# Patient Record
Sex: Female | Born: 1990 | Race: White | Hispanic: No | Marital: Single | Smoking: Never smoker
Health system: Southern US, Community
[De-identification: ages and names within clinical notes are randomized; demographics above are authoritative.]

---

## 2015-04-03 ENCOUNTER — Ambulatory Visit (INDEPENDENT_AMBULATORY_CARE_PROVIDER_SITE_OTHER): Payer: Self-pay | Admitting: Pharmacist

## 2015-04-03 ENCOUNTER — Ambulatory Visit (INDEPENDENT_AMBULATORY_CARE_PROVIDER_SITE_OTHER): Payer: Self-pay | Admitting: Family Medicine

## 2015-04-03 ENCOUNTER — Encounter: Payer: Self-pay | Admitting: Pharmacist

## 2015-04-03 ENCOUNTER — Ambulatory Visit
Admission: RE | Admit: 2015-04-03 | Discharge: 2015-04-03 | Disposition: A | Discharge status: Home / Self Care | Payer: PRIVATE HEALTH INSURANCE | Source: Ambulatory Visit | Attending: Family Medicine | Admitting: Family Medicine

## 2015-04-03 ENCOUNTER — Encounter: Payer: Self-pay | Admitting: Family Medicine

## 2015-04-03 VITALS — BP 144/81 | HR 100 | Temp 99.4°F | Ht 65.0 in | Wt 202.3 lb

## 2015-04-03 DIAGNOSIS — Y9315 Activity, underwater diving and snorkeling: Secondary | ICD-10-CM

## 2015-04-03 LAB — GLUCOSE, POCT (MANUAL RESULT ENTRY): POC GLUCOSE: 98 mg/dL (ref 70–99)

## 2015-04-03 LAB — POCT UA - GLUCOSE/PROTEIN
GLUCOSE UA: NEGATIVE
PROTEIN UA: NEGATIVE

## 2015-04-03 LAB — POCT HEMOGLOBIN: HEMOGLOBIN: 14.4 g/dL (ref 12.2–16.2)

## 2015-04-03 LAB — GLUCOSE, CAPILLARY: Glucose-Capillary: 98 mg/dL (ref 65–99)

## 2015-04-03 NOTE — Assessment & Plan Note (Signed)
Spirometry evaluation without bronchodilator reveals normal lung function.  FEV1, FVC and FEV1/FVC ratio all exceed threshold for spirometric parameters.   Reviewed results of pulmonary function tests. 

## 2015-04-03 NOTE — Assessment & Plan Note (Signed)
Vision (distance, near, color), hearing, UA, CBG, Hgb, and Spirometry all within normal limits. Normal CXR today. EKG:  n/a Approval for SCUBA diving, I find no medical conditions considered incompatible with diving. Due to age and lack of medical conditions, she qualifies for 1 year certification.

## 2015-04-03 NOTE — Progress Notes (Signed)
Subjective:    Tanya Clark is a 24 y.o. female who presents to [facility] today for scuba diving physical for the [facility]:  1.  Diving physical:  First certified in SCUBA diving age 53.  Denies any complications or injuries while diving.  Has never had a fitness to dive physical.  Currently well, without complaints.  The following portions of the patient's history were reviewed and updated as appropriate: allergies, current medications, past medical history, family and social history, and problem list.   PMH:   - denies - No other hospitalizations or other prior medical history   PSH: - denies  Family History: - noncontributory  Social: - Never smoker - Denies illicit drug use - Very occasional social drinker (1-2 drinks)  SCUBA ROS:  He denies any history of middle ear trauma/disease, vertigo, ocular surgery, asthma or other respiratory issues, seizures, loss of consciousness, recurring neurologic disorders, history of head injury, coagulopathies, evidence of CAD or other structural heart disease, pneumothorax, diabetes, or exercise intolerance.    General ROS:  The patient denies fever, unusual weight change, decreased hearing, chest pain, palpitations, pre-syncopal or syncopal episodes, dyspnea on exertion, prolonged cough, hemoptysis, change in bowel habits, melena, hematochezia, severe indigestion/heartburn, nausea/vomiting/abdominal pain, genital sores, muscle weakness, difficulty walking, abnormal bleeding, or enlarged lymph nodes.     Objective:   Physical Exam BP 144/81 mmHg  Pulse 100  Temp(Src) 99.4 F (37.4 C) (Oral)  Ht  (1.651 m)  Wt 202 lb 4.8 oz (91.763 kg)  BMI 33.66 kg/m2 Gen:  Alert, cooperative patient who appears stated age in no acute distress.  Vital signs reviewed. Head:  [place]/AT Eyes:  Fundoscopy WNL BL.  PERRL, EOMI Ears:  External ears WNL, Bilateral TM's normal without retraction, redness or bulging.  Canals clear BL  Mouth:  Good  dental hygiene. Tonsils non-erythematous, non-edematous.   MMM Neck:  Trachea midline Cardiac:  Regular rate and rhythm without murmur auscultated.   Pulm:  Clear to auscultation bilaterally with good air movement throuhout.  No wheezes or rales noted.   Abd:  Soft/nondistended/nontender.  Good bowel sounds throughout all four quadrants.  No masses noted.  Exts: No edema BL LE's, warm and well-perfused Neuro:  Alert and oriented to person, place, and date.  CN II-XII intact.  Sensation intact to light touch and vibration bilateral upper and lower extremities equally.  Motor function equal and strength 5/5 bilateral upper and lower extremities.  Normal gait.  DTRs +2 BL achilles and patellar.  Romberg negative. Color vision testing normal. Psych:  Not depressed or anxious appearing.  Linear and coherent thought process as evidenced by speech pattern. Smiles spontaneously.   Results for orders placed or performed in visit on 04/03/15 (from the past 72 hour(s))  Urinalysis - Glucose/Protein     Status: None   Collection Time: 04/03/15 10:41 AM  Result Value Ref Range   Glucose, UA negative    Protein, UA negative   Glucose (CBG)     Status: None   Collection Time: 04/03/15 10:41 AM  Result Value Ref Range   POC Glucose 98 70 - 99 mg/dl  Hemoglobin     Status: None   Collection Time: 04/03/15 10:41 AM  Result Value Ref Range   Hemoglobin 14.4 12.2 - 16.2 g/dL  Glucose, capillary     Status: None   Collection Time: 04/03/15 11:12 AM  Result Value Ref Range   Glucose-Capillary 98 65 - 99 mg/dL

## 2015-04-03 NOTE — Progress Notes (Signed)
S:    Patient arrives in good spirits.    Presents for lung function evaluation for "dive physical". Patient reports breathing has been normal.   Denies history or breathing difficulty.   O: Patient provided good effort while attempting spirometry.  FVC 4.87    Calculated Lower Limit for NOAA Diving Standards   FVC = 3.19 FEV1 4.57       Calculated Lower Limit for NOAA Diving Standards   FEV1= 2.76 FEV1/FVC  94   Calculated Lower Limit for NOAA Diving Standards   FEV1/FVC = 75.9  See "scanned report" or Documentation Flowsheet (discrete results - PFTs) for  Spirometry results and copy of evaluation.   A/P: Spirometry evaluation without bronchodilator reveals normal lung function.  FEV1, FVC and FEV1/FVC ratio all exceed threshold for spirometric parameters.   Reviewed results of pulmonary function tests.  Pt verbalized understanding of results and education.  Patient seen with Yevonne Pax, PharmD Candidate, Lilla Shook, PharmD, resident, and Dr. Sampson Goon.

## 2015-04-03 NOTE — Progress Notes (Deleted)
Subjective:    Tanya Clark is a 24 y.o. female who presents to [facility] today for scuba diving physical for the [facility]:  1.  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  Denies any complications or injuries while diving.  Has never failed a fitness to dive physical.  Currently well, without complaints.  The following portions of the patient's history were reviewed and updated as appropriate: allergies, current medications, past medical history, family and social history, and problem list.  PMH reviewed.  No past medical history on file. No past surgical history on file.  Medications reviewed. No current outpatient prescriptions on file.   No current facility-administered medications for this visit.     PMH:   - None - No other hospitalizations or other prior medical history   PSH: - None  Family History: - None  Social: - Never smoker - Denies illicit drug use - Very occasional social drinker (1-2 drinks)  SCUBA ROS:  He denies any history of middle ear trauma/disease, vertigo, ocular surgery, asthma or other respiratory issues, seizures, loss of consciousness, recurring neurologic disorders, history of head injury, coagulopathies, evidence of CAD or other structural heart disease, pneumothorax, diabetes, or exercise intolerance.    General ROS:  The patient denies fever, unusual weight change, decreased hearing, chest pain, palpitations, pre-syncopal or syncopal episodes, dyspnea on exertion, prolonged cough, hemoptysis, change in bowel habits, melena, hematochezia, severe indigestion/heartburn, nausea/vomiting/abdominal pain, genital sores, muscle weakness, difficulty walking, abnormal bleeding, or enlarged lymph nodes.     Objective:   Physical Exam BP 144/81 mmHg  Pulse 100  Temp(Src) 99.4 F (37.4 C) (Oral)  Ht  (1.651 m)  Wt 202 lb 4.8 oz (91.763 kg)  BMI 33.66 kg/m2 Gen:  Alert, cooperative patient  who appears stated age in no acute distress.  Vital signs reviewed. Head:  [place]/AT Eyes:  Fundoscopy WNL BL.  PERRL 3mm, EOMI. Ears:  External ears WNL, Bilateral TM's normal without retraction, redness or bulging.  Cerumen noted in canals bilaterally. Mouth:  Good dental hygiene. Tonsils non-erythematous, non-edematous.   MMM Neck:  Trachea midline Cardiac:  Regular rate and rhythm without murmur auscultated.   Pulm:  Clear to auscultation bilaterally with good air movement throuhout.  No wheezes or rales noted.   Abd:  Soft/nondistended/nontender.  Good bowel sounds throughout all four quadrants.  No masses noted.  Exts: No edema BL LE's, warm and well-perfused Neuro:  Alert and oriented to person, place, and date.  CN II-XII intact.  EOMI.  No astigmatism noted.  Sensation intact to light touch and vibration bilateral upper and lower extremities equally.  Motor function equal and strength 5/5 bilateral upper and lower extremities.  Normal gait.  DTRs +2 BL patellar and achilles.  Finger to nose cerebellar testing within normal limits.  Color vision testing normal.  Negative Romberg. Psych:  Not depressed or anxious appearing.  Linear and coherent thought process as evidenced by speech pattern. Smiles spontaneously.   Results for orders placed or performed in visit on 04/03/15 (from the past 72 hour(s))  Urinalysis - Glucose/Protein     Status: None   Collection Time: 04/03/15 10:41 AM  Result Value Ref Range   Glucose, UA negative    Protein, UA negative   Glucose (CBG)     Status: None   Collection Time: 04/03/15 10:41 AM  Result Value Ref Range   POC Glucose 98 70 - 99 mg/dl  Hemoglobin  Status: None   Collection Time: 04/03/15 10:41 AM  Result Value Ref Range   Hemoglobin 14.4 12.2 - 16.2 g/dL  Glucose, capillary     Status: None   Collection Time: 04/03/15 11:12 AM  Result Value Ref Range   Glucose-Capillary 98 65 - 99 mg/dL    Nelly Rout, NP [facility]

## 2015-04-06 NOTE — Progress Notes (Signed)
Patient ID: Tanya Clark, female   DOB: 04-Dec-1990, 24 y.o.   MRN: [id number] Reviewed: I agree with Dr. Macky Lower documentation and management.

## 2016-05-17 IMAGING — CR DG CHEST 2V
2 series · 2 of 2 positions shown · non-contrast
Comparison: None.

CLINICAL DATA: Pre-employment physical

EXAM:
CHEST - 2 VIEW

[w chest pa]
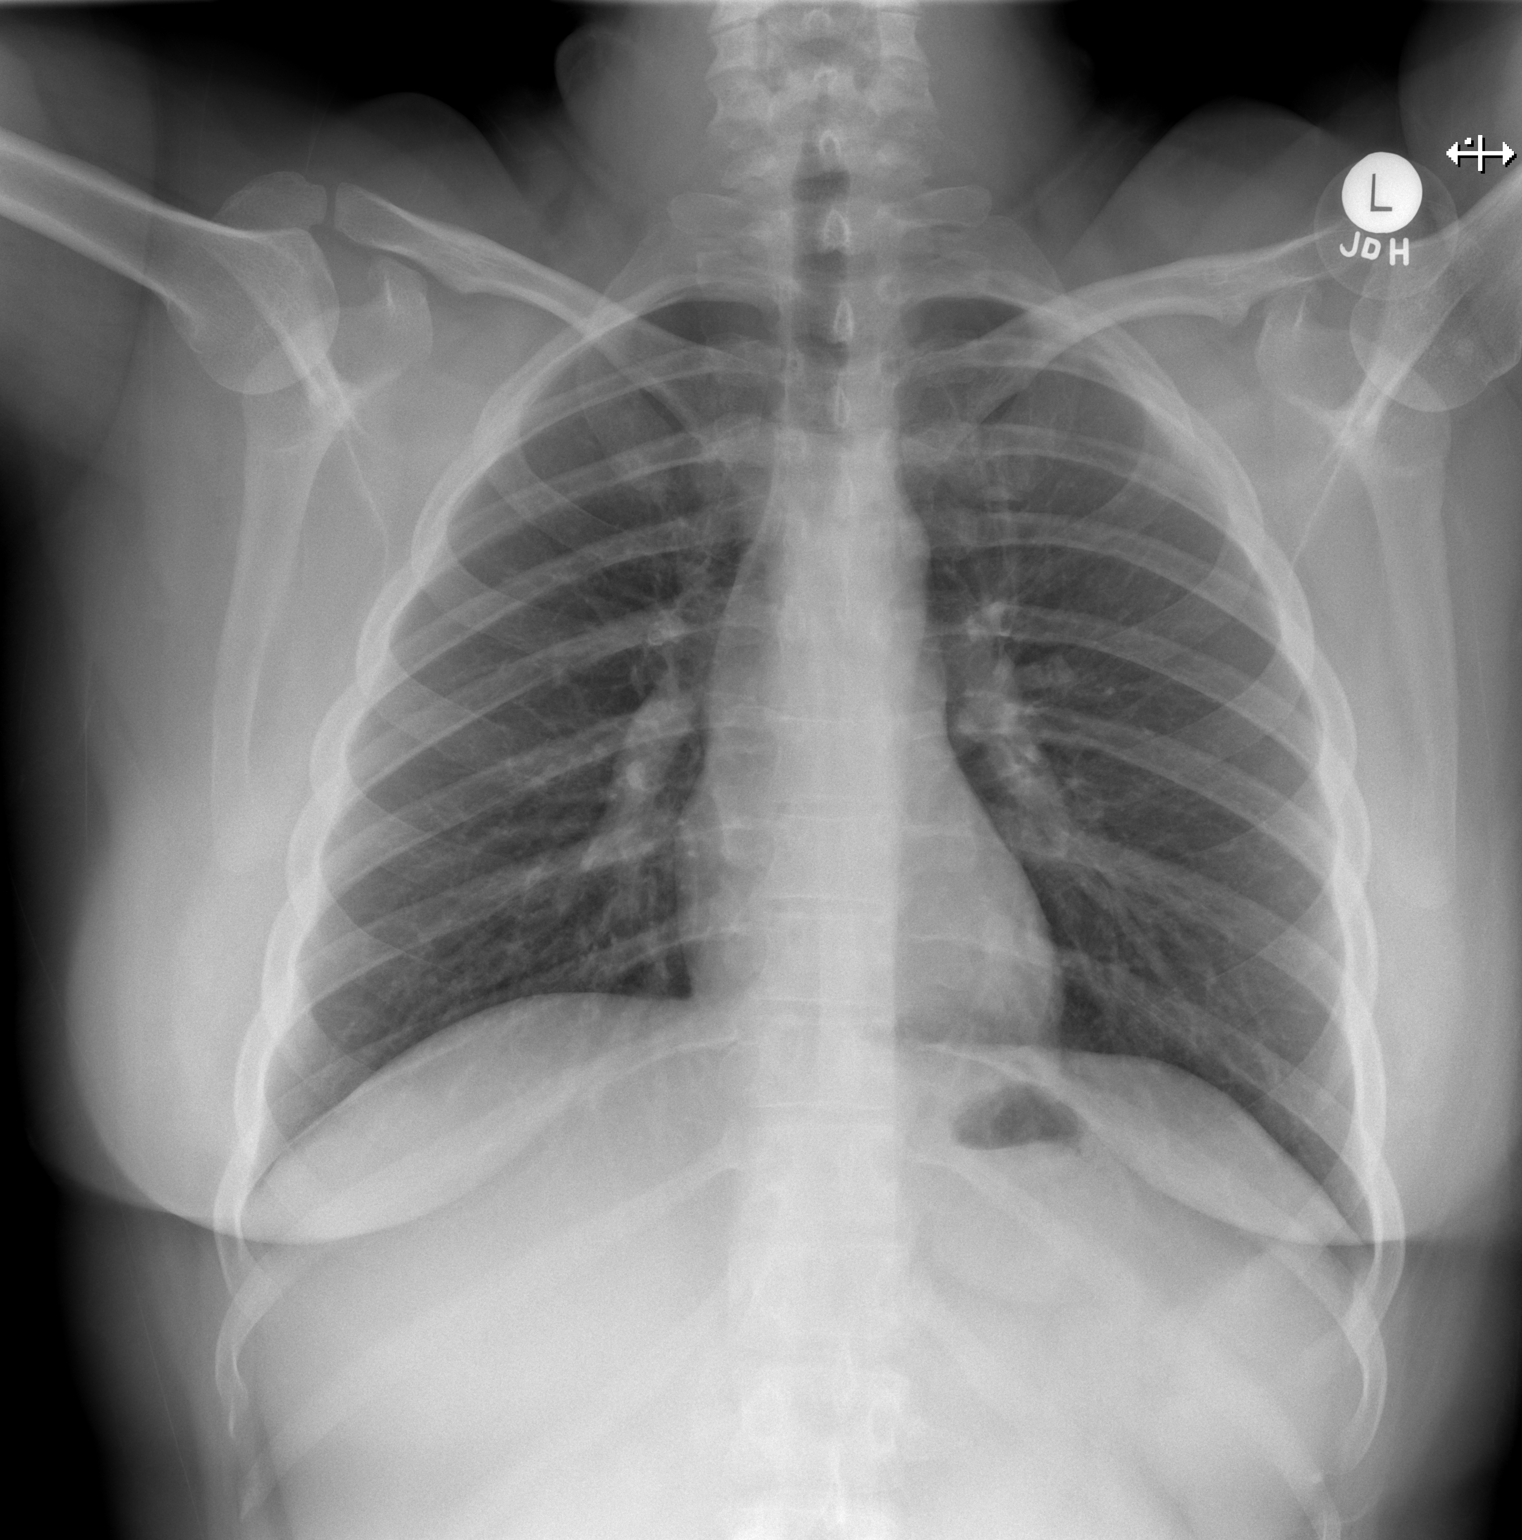

[w chest lat]
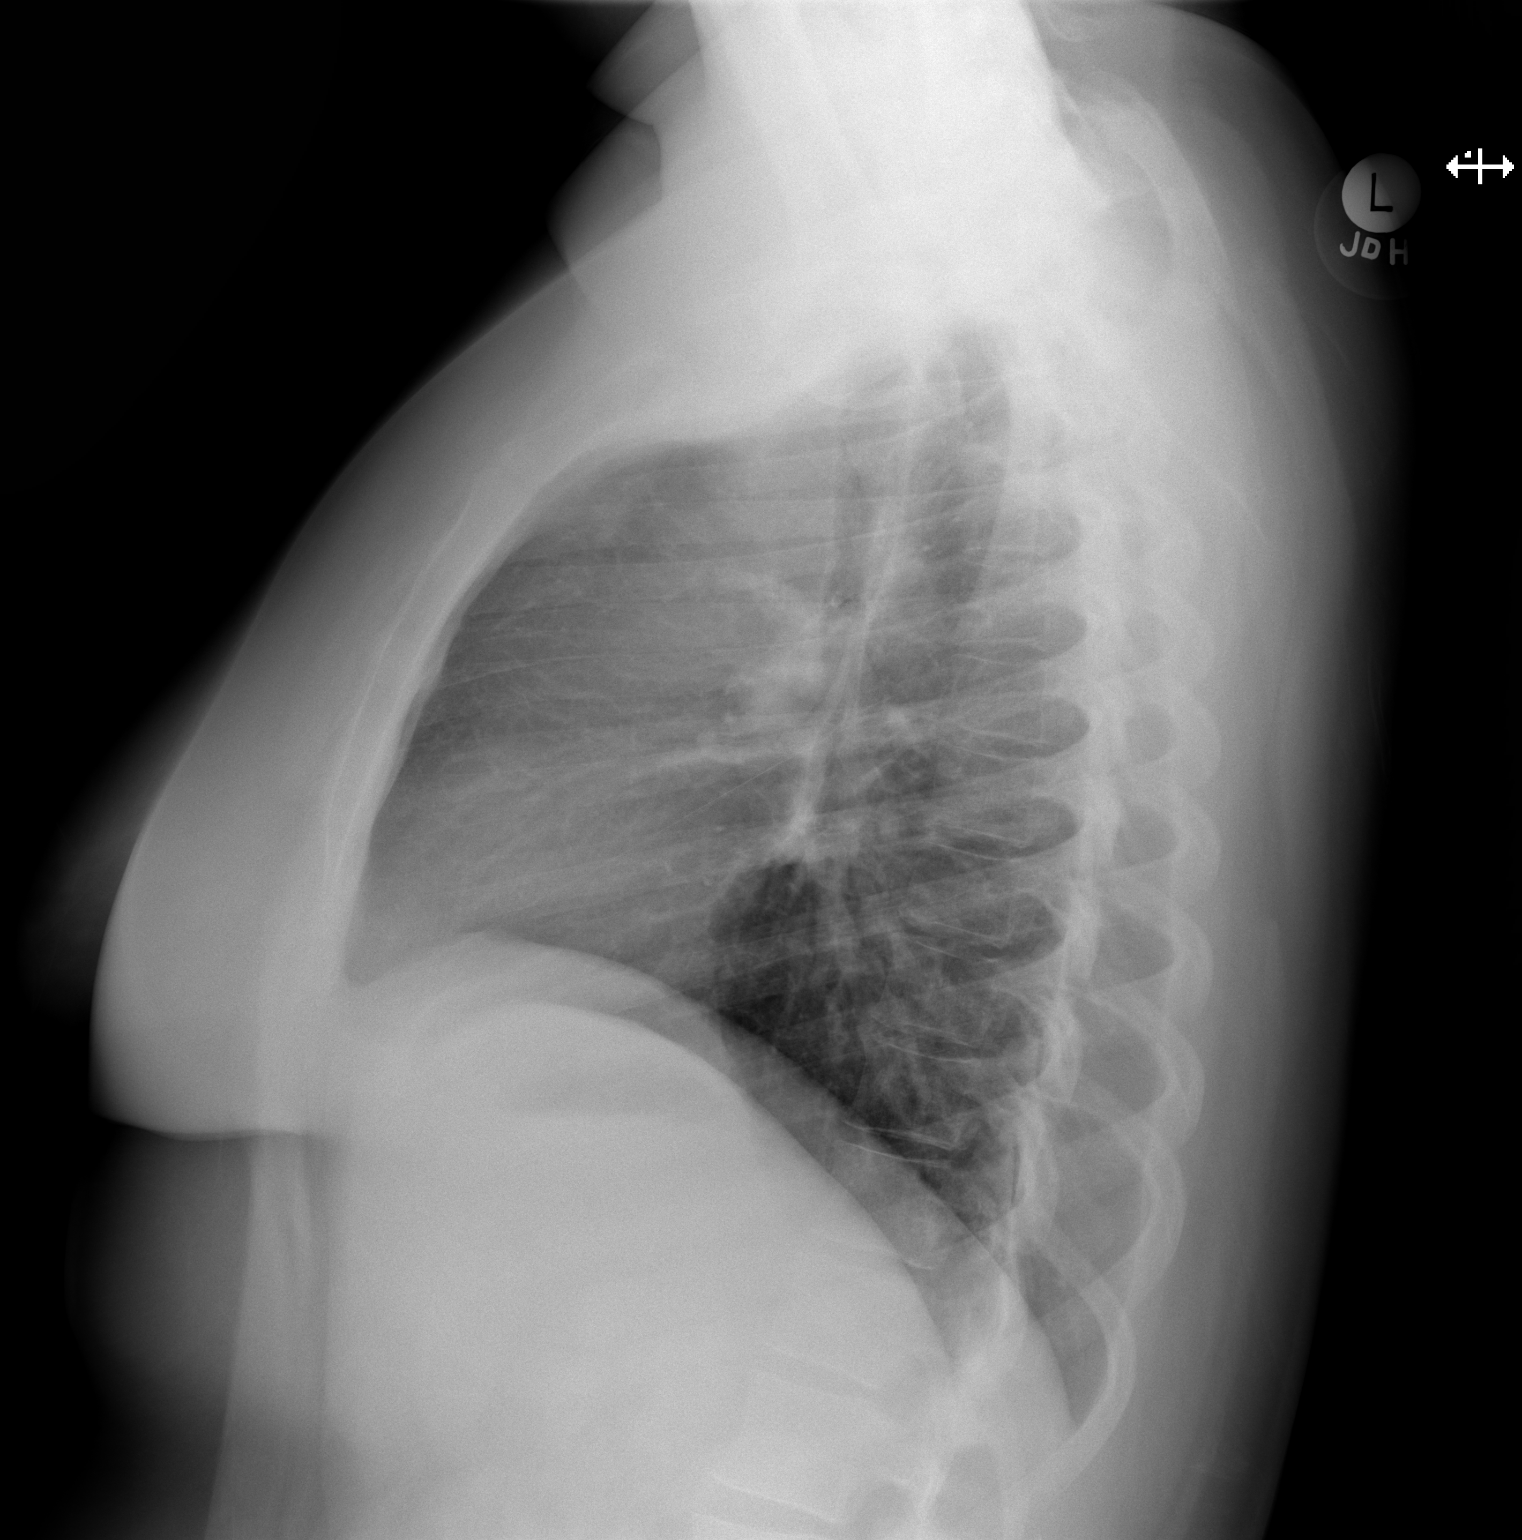

[2 of 2 positions shown; findings below may reference images not displayed]

FINDINGS: The heart size and mediastinal contours are within normal limits.
Both lungs are clear. The visualized skeletal structures are
unremarkable.
IMPRESSION: No active disease.

## 2018-05-08 ENCOUNTER — Other Ambulatory Visit: Payer: Self-pay

## 2018-05-08 ENCOUNTER — Ambulatory Visit: Payer: Self-pay | Admitting: Family Medicine

## 2018-05-08 ENCOUNTER — Encounter: Payer: Self-pay | Admitting: Family Medicine

## 2018-05-08 VITALS — BP 110/74 | HR 104 | Temp 98.3°F | Ht 64.5 in | Wt 186.0 lb

## 2018-05-08 DIAGNOSIS — Z0289 Encounter for other administrative examinations: Secondary | ICD-10-CM

## 2018-05-08 DIAGNOSIS — Y9315 Activity, underwater diving and snorkeling: Secondary | ICD-10-CM

## 2018-05-08 LAB — POCT UA - GLUCOSE/PROTEIN
Glucose, UA: NEGATIVE
PROTEIN UA: POSITIVE — AB

## 2018-05-08 LAB — GLUCOSE, POCT (MANUAL RESULT ENTRY): POC Glucose: 98 mg/dl (ref 70–99)

## 2018-05-08 LAB — POCT HEMOGLOBIN: Hemoglobin: 13.9 g/dL — AB (ref 9.5–13.5)

## 2018-05-08 NOTE — Progress Notes (Signed)
S:    Patient arrives in good spirits and without complaint.  Presents for lung function evaluation for "dive physical". Patient reports breathing has been normal throughout entire life.   Denies history or breathing difficulty.   O: Patient provided good effort while attempting spirometry.  FVC 4.48    Calculated Lower Limit for NOAA Diving Standards   FVC = 3.19 FEV1 3.93       Calculated Lower Limit for NOAA Diving Standards   FEV1= 2.72 FEV1/FVC  87.7   Calculated Lower Limit for NOAA Diving Standards   FEV1/FVC = 75.2  See "scanned report" or Documentation Flowsheet (discrete results - PFTs) for  Spirometry results and copy of evaluation.   A/P: Spirometry evaluation without bronchodilator reveals normal lung function.  FEV1, FVC and FEV1/FVC ratio all exceed threshold for spirometric parameters.   Reviewed results of pulmonary function tests.  Pt verbalized understanding of results and education.  Patient seen with Belva Agee, PharmD Candidate. Marland Kitchen

## 2018-05-08 NOTE — Progress Notes (Signed)
Subjective:    Shonette Rhames is a 27 y.o. female who presents to [facility] today for scuba diving physical for the [facility]:  1.  Diving physical:  First certified in SCUBA diving several years ago, active diver since that time.  Denies any complications or injuries while diving.  Has never failed a fitness to dive physical.  Currently well, without complaints.  The following portions of the patient's history were reviewed and updated as appropriate: allergies, current medications, past medical history, family and social history, and problem list.  Medications reviewed. No current outpatient medications on file.   No current facility-administered medications for this visit.      PMH:   - denies any past medical conditions  - No other hospitalizations or other prior medical history   PSH: - Nexplanon, otherwise denies  Family History: - noncontributory.  No family history of cardiac or pulmonary disease.    Social: - Never smoker - Denies illicit drug use - Very occasional social drinker (1-2 drinks)  SCUBA ROS:  He denies any history of middle ear trauma/disease, vertigo, ocular surgery, asthma or other respiratory issues, seizures, loss of consciousness, recurring neurologic disorders, history of head injury, coagulopathies, evidence of CAD or other structural heart disease, pneumothorax, diabetes, or exercise intolerance.    General ROS:  The patient denies fever, unusual weight change, decreased hearing, chest pain, palpitations, pre-syncopal or syncopal episodes, dyspnea on exertion, prolonged cough, hemoptysis, change in bowel habits, melena, hematochezia, severe indigestion/heartburn, nausea/vomiting/abdominal pain, genital sores, muscle weakness, difficulty walking, abnormal bleeding, or enlarged lymph nodes.     Objective:   Physical Exam BP 110/74   Pulse (!) 104   Temp 98.3 F (36.8 C) (Oral)   Ht 5' 4.5" (1.638 m)   Wt 186 lb (84.4 kg)   LMP  05/01/2018 (Exact Date)   SpO2 99%   BMI 31.43 kg/m  Gen:  Alert, cooperative patient who appears stated age in no acute distress.  Vital signs reviewed. Head:  [place]/AT Eyes:  Fundoscopy WNL BL.  PERRL, EOMI Ears:  External ears WNL, Bilateral TM's normal without retraction, redness or bulging.  Canals clear BL  Mouth:  Good dental hygiene. Tonsils non-erythematous, non-edematous.   MMM Neck:  Trachea midline Cardiac:  Regular rate and rhythm without murmur auscultated.   Pulm:  Clear to auscultation bilaterally with good air movement throuhout.  No wheezes or rales noted.   Abd:  Soft/nondistended/nontender.  Good bowel sounds throughout all four quadrants.  No masses noted.  Exts: No edema BL LE's, warm and well-perfused Neuro:  Alert and oriented to person, place, and date.  CN II-XII intact.  Sensation intact to light touch and vibration bilateral upper and lower extremities equally.  Motor function equal and strength 5/5 bilateral upper and lower extremities.  Normal gait.  DTRs +2 BL patellar. Finger to nose cerebellar testing within normal limits.  Color vision testing normal. Psych:  Not depressed or anxious appearing.  Linear and coherent thought process as evidenced by speech pattern. Smiles spontaneously.   No results found for this or any previous visit (from the past 72 hour(s)).

## 2018-05-08 NOTE — Patient Instructions (Signed)
It was good to see you again today.  You had a little bit of protein in your urine, typical of being dehydrated.    Come back by this afternoon with your urine.  Make sure that you are drinking plenty of water until then.    When that looks good, I'll sign off on your paperwork

## 2018-05-11 ENCOUNTER — Encounter: Payer: Self-pay | Admitting: Family Medicine

## 2018-05-11 NOTE — Assessment & Plan Note (Signed)
Vision (distance, near, color), hearing, UA, CBG, Hgb, and Spirometry all within normal limits. Normal CXR within past 5 years. EKG:  n/a Coronary assessment:  N/a due to age Approval for SCUBA diving, I find no medical conditions considered incompatible with diving.

## 2018-08-02 NOTE — Addendum Note (Signed)
Addended by: Kathrin Ruddy on: 08/02/2018 04:56 PM   Modules accepted: Orders

## 2019-05-08 ENCOUNTER — Telehealth: Payer: Self-pay

## 2019-05-08 NOTE — Telephone Encounter (Signed)
Patient called.  She states that she was exposed to a co-worker who tested positive for the COVID-19. Her employer is requesting she be tested as negative prior to return to work.  She was informed that she should quarantine 14 dy and monitor for symptoms. Most show symptoms fro 2-14 days after exposure. She requested advice as to when to test. She was advised that 7-10 days 50 % show symptoms.  She will also consult the HD and CDC for information.  She verbalized understanding of all information.

## 2019-05-10 ENCOUNTER — Other Ambulatory Visit: Payer: Self-pay

## 2019-05-10 DIAGNOSIS — Z20822 Contact with and (suspected) exposure to covid-19: Secondary | ICD-10-CM

## 2019-05-11 LAB — NOVEL CORONAVIRUS, NAA: SARS-CoV-2, NAA: NOT DETECTED

## 2020-02-04 ENCOUNTER — Ambulatory Visit (HOSPITAL_COMMUNITY)
Admission: EM | Admit: 2020-02-04 | Discharge: 2020-02-04 | Disposition: A | Discharge status: Home / Self Care | Payer: BC Managed Care – PPO | Attending: Urgent Care | Admitting: Urgent Care

## 2020-02-04 ENCOUNTER — Encounter (HOSPITAL_COMMUNITY): Payer: Self-pay | Admitting: Emergency Medicine

## 2020-02-04 ENCOUNTER — Other Ambulatory Visit: Payer: Self-pay

## 2020-02-04 DIAGNOSIS — R0789 Other chest pain: Secondary | ICD-10-CM

## 2020-02-04 MED ORDER — TIZANIDINE HCL 4 MG PO TABS: 4.0000 mg | ORAL_TABLET | Freq: Every day | ORAL | 0 refills | Status: DC

## 2020-02-04 MED ORDER — NAPROXEN 500 MG PO TABS: 500.0000 mg | ORAL_TABLET | Freq: Two times a day (BID) | ORAL | 0 refills | Status: DC

## 2020-02-04 NOTE — ED Triage Notes (Signed)
Woke up with chest tightness, denies pain.  Initially thought it was anxiety, but it has continued throughout the day  Tightness is generalized across chest. No radiation to neck, back , arm. No nausea, fatigue, no shortness of breath.

## 2020-02-04 NOTE — ED Provider Notes (Signed)
[facility]   MRN: [id number] DOB: May 27, 1991  Subjective:   Tanya Clark is a 29 y.o. female presenting for acute onset this morning of chest wall pain that is like a tightness, pulling sensation across her chest. Denies fever, cough, shob, n/v, abdominal pain, loss of sense of taste and smell.  Has not tried medications for relief.  Patient also reports that she does a lot of strenuous work activities at [employer], does a lot of fast-paced work, does a lot of intermittent heavy lifting.  No current facility-administered medications for this encounter. No current outpatient medications on file.   Allergies  Allergen Reactions  . Penicillins Hives    Reports history of hives when she was young   History reviewed. No pertinent past medical history.   History reviewed. No pertinent surgical history.  No family history on file.  Social History   Tobacco Use  . Smoking status: Never Smoker  . Smokeless tobacco: Never Used  Substance Use Topics  . Alcohol use: Not on file  . Drug use: Not on file    ROS   Objective:   Vitals: BP (!) 103/62   Pulse 81   Temp 98.6 F (37 C) (Oral)   Resp 16   LMP 02/03/2020   SpO2 100%   Physical Exam Constitutional:      General: She is not in acute distress.    Appearance: Normal appearance. She is well-developed. She is not ill-appearing, toxic-appearing or diaphoretic.  HENT:     Head: Normocephalic and atraumatic.     Right Ear: External ear normal.     Left Ear: External ear normal.     Nose: Nose normal.     Mouth/Throat:     Mouth: Mucous membranes are moist.  Eyes:     General: No scleral icterus.       Right eye: No discharge.        Left eye: No discharge.     Extraocular Movements: Extraocular movements intact.     Conjunctiva/sclera: Conjunctivae normal.     Pupils: Pupils are equal, round, and reactive to light.  Cardiovascular:     Rate and Rhythm: Normal rate and regular rhythm.     Pulses:  Normal pulses.     Heart sounds: Normal heart sounds. No murmur heard.  No friction rub. No gallop.   Pulmonary:     Effort: Pulmonary effort is normal. No respiratory distress.     Breath sounds: Normal breath sounds. No stridor. No wheezing, rhonchi or rales.  Skin:    General: Skin is warm and dry.     Findings: No rash.  Neurological:     Mental Status: She is alert and oriented to person, place, and time.  Psychiatric:        Mood and Affect: Mood normal.        Behavior: Behavior normal.        Thought Content: Thought content normal.        Judgment: Judgment normal.      Assessment and Plan :   PDMP not reviewed this encounter.  1. Chest wall pain   2. Chest tightness     We will manage conservatively for musculoskeletal type pain associated with the nature of her work.  Counseled on use of NSAID, muscle relaxant and modification of physical activity.  Anticipatory guidance provided.  Given clear lung sounds, will defer imaging.  Low suspicion for COVID-19, patient has been vaccinated and therefore will hold  off on this as well.  Counseled patient on potential for adverse effects with medications prescribed/recommended today, ER and return-to-clinic precautions discussed, patient verbalized understanding.    Wallis Bamberg, [place] 02/05/20 [phone]

## 2020-02-05 ENCOUNTER — Encounter (HOSPITAL_COMMUNITY): Payer: Self-pay | Admitting: Urgent Care

## 2021-02-04 ENCOUNTER — Ambulatory Visit: Payer: Self-pay | Admitting: Sports Medicine

## 2021-02-09 ENCOUNTER — Other Ambulatory Visit: Payer: Self-pay

## 2021-02-09 ENCOUNTER — Ambulatory Visit (INDEPENDENT_AMBULATORY_CARE_PROVIDER_SITE_OTHER): Payer: Self-pay | Admitting: Sports Medicine

## 2021-02-09 VITALS — BP 120/84 | Ht 65.0 in | Wt 190.0 lb

## 2021-02-09 DIAGNOSIS — Y9315 Activity, underwater diving and snorkeling: Secondary | ICD-10-CM

## 2021-02-09 DIAGNOSIS — Z0189 Encounter for other specified special examinations: Secondary | ICD-10-CM

## 2021-02-09 NOTE — Progress Notes (Signed)
   Subjective:    Patient ID: Tanya Clark, female    DOB: 30-Jul-1990, 30 y.o.   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  Many of her coworkers tested positive.  She began to experience a mild tickle in her throat on July 7.  A home test proved her to be COVID-positive.  In addition to the mild throat irritation, she also began to experience some mild congestion.  Symptoms resolved very quickly.  She denies any fevers, chills, or coughing.  No chest pain.  She has been asymptomatic for many days.  She has resumed her recreational walking program without any difficulty.  She has no history of cardiopulmonary disease.  Past medical history reviewed Medications reviewed Allergies reviewed    Review of Systems As above    Objective:   Physical Exam  Well-developed, well-nourished.  No acute distress  Cardiovascular: Regular rate.  No murmurs, rubs, or gallops Lungs: Clear to auscultation bilaterally.  No rhonchi's, rales, or wheezes.      Assessment & Plan:   Healthy 30 year old female  Patient is cleared to resume scuba diving without restriction.  Appropriate forms were completed for her employer.  Copy of those forms are available in epic.  She will follow-up as needed.

## 2021-09-27 ENCOUNTER — Other Ambulatory Visit: Payer: Self-pay

## 2021-09-27 ENCOUNTER — Ambulatory Visit
Admission: EM | Admit: 2021-09-27 | Discharge: 2021-09-27 | Disposition: A | Discharge status: Home / Self Care | Payer: 59

## 2021-09-27 DIAGNOSIS — J310 Chronic rhinitis: Secondary | ICD-10-CM

## 2021-09-27 DIAGNOSIS — J4521 Mild intermittent asthma with (acute) exacerbation: Secondary | ICD-10-CM

## 2021-09-27 DIAGNOSIS — R062 Wheezing: Secondary | ICD-10-CM

## 2021-09-27 DIAGNOSIS — J329 Chronic sinusitis, unspecified: Secondary | ICD-10-CM

## 2021-09-27 MED ORDER — CETIRIZINE HCL 10 MG PO TABS: 10.0000 mg | ORAL_TABLET | Freq: Every day | ORAL | 0 refills | Status: DC

## 2021-09-27 MED ORDER — ALBUTEROL SULFATE HFA 108 (90 BASE) MCG/ACT IN AERS: 2.0000 | INHALATION_SPRAY | Freq: Four times a day (QID) | RESPIRATORY_TRACT | 1 refills | Status: DC | PRN

## 2021-09-27 MED ORDER — IPRATROPIUM BROMIDE 0.06 % NA SOLN: 2.0000 | Freq: Four times a day (QID) | NASAL | 1 refills | Status: DC

## 2021-09-27 MED ORDER — AEROCHAMBER PLUS FLO-VU LARGE MISC: 1.0000 | Freq: Once | 0 refills | Status: AC

## 2021-09-27 MED ORDER — METHYLPREDNISOLONE SODIUM SUCC 125 MG IJ SOLR
125.0000 mg | Freq: Once | INTRAMUSCULAR | Status: AC
  Administered 2021-09-27: 125 mg via INTRAMUSCULAR

## 2021-09-27 MED ORDER — IBUPROFEN 400 MG PO TABS: 400.0000 mg | ORAL_TABLET | Freq: Three times a day (TID) | ORAL | 0 refills | Status: DC | PRN

## 2021-09-27 MED ORDER — ALBUTEROL SULFATE (2.5 MG/3ML) 0.083% IN NEBU
2.5000 mg | INHALATION_SOLUTION | Freq: Once | RESPIRATORY_TRACT | Status: AC
  Administered 2021-09-27: 2.5 mg via RESPIRATORY_TRACT

## 2021-09-27 NOTE — Discharge Instructions (Addendum)
Please see the list below for recommended medications, dosages and frequencies to provide relief of your current symptoms:   ?  ?Methylprednisolone IM (Solu-Medrol):  To quickly address your significant respiratory inflammation, you were provided with an injection of methylprednisolone in the office today.  You should continue to feel the full benefit of the steroid for the next 4 to 6 hours.  ?  ?Albuterol HFA: This is a bronchodilator, it relaxes the smooth muscles that constrict your airway in your lungs when you are feeling sick or having inflammation secondary to allergies or upper respiratory infection.  Please inhale 2 puffs twice daily every day using the spacer provided.  You can also inhale 2 more puffs as often as needed throughout the day for aggravating cough, chest tightness, feeling short of breath, wheezing.    ?  ?Ibuprofen  (Advil, Motrin): This is a good anti-inflammatory medication which addresses aches, pains and inflammation of the upper airways that causes sinus and nasal congestion as well as in the lower airways which makes your cough feel tight and sometimes burn.  I recommend that you take between 400 to 600 mg every 6-8 hours as needed, I have provided you with a prescription for 400 mg.    ?  ?Cetirizine (Zyrtec): This is an excellent second-generation antihistamine that helps to reduce respiratory inflammatory response to viruses and environmental allergens.  Please take 1 tablet daily at bedtime. ?  ?Ipratropium (Atrovent): This is an excellent nasal decongestant spray that does not cause rebound congestion, can be used up to 4 times daily as needed, instill 2 sprays into each nare with each use.  I have provided you with a prescription for this medication.    ?  ?Please follow-up within the next 3 to 5 days either with your primary care provider or urgent care if your symptoms do not resolve.  If you do not have a primary care provider, we will assist you in finding one. ?  ?Thank you  for visiting urgent care today.  We appreciate the opportunity to participate in your care. ? ?

## 2021-09-27 NOTE — ED Triage Notes (Signed)
Pt c/o productive cough for several weeks. ?

## 2021-09-27 NOTE — ED Provider Notes (Signed)
?[facility] ? ? ? ?CSN: [id number] ?Arrival date & time: 09/27/21  1205 ?  ? ?HISTORY  ? ?Chief Complaint  ?Patient presents with  ? Cough  ? ?HPI ?Tanya Clark is a 31 y.o. female. Patient complains of several weeks of a productive cough.  Patient denies history of asthma and allergies however states that she had COVID-19 in 2021, she has had frequent episodes of lower respiratory infections with cough and felt that she has been more "sensitive" to environmental allergens.  Patient states she is not currently taking any allergy medications nor has she ever been prescribed albuterol. ? ? ?Cough ?History reviewed. No pertinent past medical history. ?There are no problems to display for this patient. ? ?History reviewed. No pertinent surgical history. ?OB History   ?No obstetric history on file. ?  ? ?Home Medications   ? ?Prior to Admission medications   ?Not on File  ? ?Family History ?History reviewed. No pertinent family history. ?Social History ?Social History  ? ?Tobacco Use  ? Smoking status: Never  ? Smokeless tobacco: Never  ?Substance Use Topics  ? Alcohol use: Yes  ?  Comment: occasionally  ? Drug use: Never  ? ?Allergies   ?Penicillins ? ?Review of Systems ?Review of Systems  ?Respiratory:  Positive for cough.   ?Pertinent findings noted in history of present illness.  ? ?Physical Exam ?Triage Vital Signs ?ED Triage Vitals  ?Enc Vitals Group  ?   BP 05/07/21 0827 (!) 147/82  ?   Pulse Rate 05/07/21 0827 72  ?   Resp 05/07/21 0827 18  ?   Temp 05/07/21 0827 98.3 ?F (36.8 ?C)  ?   Temp Source 05/07/21 0827 Oral  ?   SpO2 05/07/21 0827 98 %  ?   Weight --   ?   Height --   ?   Head Circumference --   ?   Peak Flow --   ?   Pain Score 05/07/21 0826 5  ?   Pain Loc --   ?   Pain Edu? --   ?   Excl. in GC? --   ?No data found. ? ?Updated Vital Signs ?BP 132/87 (BP Location: Right Arm)   Pulse 89   Temp 98.1 ?F (36.7 ?C) (Oral)   Resp 18   LMP 09/08/2021   SpO2 98%  ? ?Physical Exam ?Vitals and  nursing note reviewed.  ?Constitutional:   ?   General: She is not in acute distress. ?   Appearance: Normal appearance. She is not ill-appearing.  ?HENT:  ?   Head: Normocephalic and atraumatic.  ?   Salivary Glands: Right salivary gland is not diffusely enlarged or tender. Left salivary gland is not diffusely enlarged or tender.  ?   Right Ear: Ear canal and external ear normal. No drainage. A middle ear effusion is present. There is no impacted cerumen. Tympanic membrane is bulging. Tympanic membrane is not injected or erythematous.  ?   Left Ear: Ear canal and external ear normal. No drainage. A middle ear effusion is present. There is no impacted cerumen. Tympanic membrane is bulging. Tympanic membrane is not injected or erythematous.  ?   Ears:  ?   Comments: Bilateral EACs normal, both TMs bulging with clear fluid ?   Nose: Rhinorrhea present. No nasal deformity, septal deviation, signs of injury, nasal tenderness, mucosal edema or congestion. Rhinorrhea is clear.  ?   Right Nostril: Occlusion present. No foreign body, epistaxis or septal  hematoma.  ?   Left Nostril: Occlusion present. No foreign body, epistaxis or septal hematoma.  ?   Right Turbinates: Enlarged, swollen and pale.  ?   Left Turbinates: Enlarged, swollen and pale.  ?   Right Sinus: No maxillary sinus tenderness or frontal sinus tenderness.  ?   Left Sinus: No maxillary sinus tenderness or frontal sinus tenderness.  ?   Mouth/Throat:  ?   Lips: Pink. No lesions.  ?   Mouth: Mucous membranes are moist. No oral lesions.  ?   Pharynx: Oropharynx is clear. Uvula midline. No posterior oropharyngeal erythema or uvula swelling.  ?   Tonsils: No tonsillar exudate. 0 on the right. 0 on the left.  ?   Comments: Postnasal drip ?Eyes:  ?   General: Lids are normal.     ?   Right eye: No discharge.     ?   Left eye: No discharge.  ?   Extraocular Movements: Extraocular movements intact.  ?   Conjunctiva/sclera: Conjunctivae normal.  ?   Right eye: Right  conjunctiva is not injected.  ?   Left eye: Left conjunctiva is not injected.  ?Neck:  ?   Trachea: Trachea and phonation normal.  ?Cardiovascular:  ?   Rate and Rhythm: Normal rate and regular rhythm.  ?   Pulses: Normal pulses.  ?   Heart sounds: Normal heart sounds. No murmur heard. ?  No friction rub. No gallop.  ?Pulmonary:  ?   Effort: Pulmonary effort is normal. No accessory muscle usage, prolonged expiration or respiratory distress.  ?   Breath sounds: No stridor, decreased air movement or transmitted upper airway sounds. Examination of the right-upper field reveals wheezing. Examination of the left-upper field reveals wheezing. Examination of the right-middle field reveals wheezing and rhonchi. Examination of the left-middle field reveals wheezing and rhonchi. Examination of the right-lower field reveals wheezing. Examination of the left-lower field reveals wheezing. Wheezing and rhonchi present. No decreased breath sounds or rales.  ?   Comments: Repeat auscultation post nebulized albuterol treatment revealed decreased wheezing and absence of rhonchi. ?Chest:  ?   Chest wall: No tenderness.  ?Musculoskeletal:     ?   General: Normal range of motion.  ?   Cervical back: Normal range of motion and neck supple. Normal range of motion.  ?Lymphadenopathy:  ?   Cervical: No cervical adenopathy.  ?Skin: ?   General: Skin is warm and dry.  ?   Findings: No erythema or rash.  ?Neurological:  ?   General: No focal deficit present.  ?   Mental Status: She is alert and oriented to person, place, and time.  ?Psychiatric:     ?   Mood and Affect: Mood normal.     ?   Behavior: Behavior normal.  ? ? ?Visual Acuity ?Right Eye Distance:   ?Left Eye Distance:   ?Bilateral Distance:   ? ?Right Eye Near:   ?Left Eye Near:    ?Bilateral Near:    ? ?UC Couse / Diagnostics / Procedures:  ?  ?EKG ? ?Radiology ?No results found. ? ?Procedures ?Procedures (including critical care time) ? ?UC Diagnoses / Final Clinical  Impressions(s)   ?I have reviewed the triage vital signs and the nursing notes. ? ?Pertinent labs & imaging results that were available during my care of the patient were reviewed by me and considered in my medical decision making (see chart for details).   ?Final diagnoses:  ?Wheezing  ?Mild intermittent  reactive airway disease with acute exacerbation  ?Rhinosinusitis  ? ?Patient had good response to albuterol treatment during visit today, recommend she continue albuterol for wheezing or rhonchi.  Patient provided with Zyrtec, advised patient to continue taking through June given new sensitivity to environmental allergens after COVID-19 in 2021.  Patient was provided with ipratropium a spray to rapidly address nasal congestion this time.  Given duration of symptoms, patient would not benefit from viral testing or antiviral treatment.  Patient is not showing any signs or symptoms of bacterial infection at this time.  Antibiotics would not be of use at this time.  Return precautions advised. ? ?ED Prescriptions   ? ? Medication Sig Dispense Auth. Provider  ? albuterol (VENTOLIN HFA) 108 (90 Base) MCG/ACT inhaler Inhale 2 puffs into the lungs every 6 (six) hours as needed for wheezing or shortness of breath (Cough). 18 g Lynden Oxford Scales, PA-C  ? Spacer/Aero-Holding Chambers (AEROCHAMBER PLUS FLO-VU LARGE) MISC 1 each by Other route once for 1 dose. 1 each Lynden Oxford Scales, PA-C  ? ipratropium (ATROVENT) 0.06 % nasal spray Place 2 sprays into both nostrils 4 (four) times daily. As needed for nasal congestion, runny nose 15 mL Lynden Oxford Scales, PA-C  ? ibuprofen (ADVIL) 400 MG tablet Take 1 tablet (400 mg total) by mouth every 8 (eight) hours as needed for up to 30 doses. 30 tablet Lynden Oxford Scales, PA-C  ? cetirizine (ZYRTEC ALLERGY) 10 MG tablet Take 1 tablet (10 mg total) by mouth at bedtime. 180 tablet Lynden Oxford Scales, PA-C  ? ?  ? ?PDMP not reviewed this encounter. ? ?Pending  results:  ?Labs Reviewed - No data to display ? ?Medications Ordered in UC: ?Medications  ?albuterol (PROVENTIL) (2.5 MG/3ML) 0.083% nebulizer solution 2.5 mg (has no administration in time range)  ?methylPREDNISolone sodium s

## 2022-05-10 ENCOUNTER — Ambulatory Visit
Admission: EM | Admit: 2022-05-10 | Discharge: 2022-05-10 | Disposition: A | Discharge status: Home / Self Care | Payer: 59 | Attending: Emergency Medicine | Admitting: Emergency Medicine

## 2022-05-10 DIAGNOSIS — B9789 Other viral agents as the cause of diseases classified elsewhere: Secondary | ICD-10-CM | POA: Diagnosis present

## 2022-05-10 DIAGNOSIS — J4521 Mild intermittent asthma with (acute) exacerbation: Secondary | ICD-10-CM | POA: Diagnosis present

## 2022-05-10 DIAGNOSIS — Z1152 Encounter for screening for COVID-19: Secondary | ICD-10-CM | POA: Insufficient documentation

## 2022-05-10 DIAGNOSIS — J988 Other specified respiratory disorders: Secondary | ICD-10-CM | POA: Diagnosis present

## 2022-05-10 DIAGNOSIS — J302 Other seasonal allergic rhinitis: Secondary | ICD-10-CM | POA: Insufficient documentation

## 2022-05-10 LAB — RESP PANEL BY RT-PCR (FLU A&B, COVID) ARPGX2
Influenza A by PCR: NEGATIVE
Influenza B by PCR: NEGATIVE
SARS Coronavirus 2 by RT PCR: NEGATIVE

## 2022-05-10 MED ORDER — ALBUTEROL SULFATE HFA 108 (90 BASE) MCG/ACT IN AERS: 2.0000 | INHALATION_SPRAY | Freq: Four times a day (QID) | RESPIRATORY_TRACT | 0 refills | Status: DC | PRN

## 2022-05-10 MED ORDER — OSELTAMIVIR PHOSPHATE 75 MG PO CAPS: 75.0000 mg | ORAL_CAPSULE | Freq: Two times a day (BID) | ORAL | 0 refills | Status: AC

## 2022-05-10 MED ORDER — METHYLPREDNISOLONE SODIUM SUCC 125 MG IJ SOLR
125.0000 mg | Freq: Once | INTRAMUSCULAR | Status: AC
  Administered 2022-05-10: 125 mg via INTRAMUSCULAR

## 2022-05-10 MED ORDER — METHYLPREDNISOLONE 4 MG PO TBPK: ORAL_TABLET | ORAL | 0 refills | Status: DC

## 2022-05-10 MED ORDER — TRELEGY ELLIPTA 200-62.5-25 MCG/ACT IN AEPB: 1.0000 | INHALATION_SPRAY | Freq: Every morning | RESPIRATORY_TRACT | 0 refills | Status: AC

## 2022-05-10 MED ORDER — ALBUTEROL SULFATE HFA 108 (90 BASE) MCG/ACT IN AERS: 2.0000 | INHALATION_SPRAY | Freq: Four times a day (QID) | RESPIRATORY_TRACT | 2 refills | Status: AC | PRN

## 2022-05-10 MED ORDER — FLUTICASONE PROPIONATE 50 MCG/ACT NA SUSP: 1.0000 | Freq: Every day | NASAL | 1 refills | Status: AC

## 2022-05-10 MED ORDER — ALBUTEROL SULFATE (2.5 MG/3ML) 0.083% IN NEBU
2.5000 mg | INHALATION_SOLUTION | Freq: Once | RESPIRATORY_TRACT | Status: AC
  Administered 2022-05-10: 2.5 mg via RESPIRATORY_TRACT

## 2022-05-10 MED ORDER — IPRATROPIUM BROMIDE 0.06 % NA SOLN: 2.0000 | Freq: Three times a day (TID) | NASAL | 1 refills | Status: DC

## 2022-05-10 MED ORDER — CETIRIZINE HCL 10 MG PO TABS: 10.0000 mg | ORAL_TABLET | Freq: Every day | ORAL | 1 refills | Status: DC

## 2022-05-10 NOTE — ED Triage Notes (Signed)
Pt c/o sore throat, congestion, productive cough, and SOB. The patient states at home covid test was inconclusive.   Started: yesterday

## 2022-05-10 NOTE — Discharge Instructions (Addendum)
As before, I have avoided diagnosing you with asthma because I do not have adequate diagnostic testing here at urgent care to support asthma diagnosis.  I do believe that you have something called reactive airway disease which appears to be intermittent and not persistent, based on the history you provided to me today.,  It definitely deserves to be treated  You received a COVID-19 and influenza PCR next today.  The results of your PCR testing will be posted to your MyChart once it is complete.  This typically takes 6 to 12 hours.    If your COVID-19 PCR test is positive, you will be contacted by phone.  Please discuss with the callback nurse whether or not you would benefit from antiviral therapy treatment for COVID-19.  With your history of reactive airway disease and frequent exacerbations, I believe that you would benefit from a 5-day course of Paxlovid.     If your COVID-19 PCR test is negative, please consider retesting in the next 2 to 3 days, particularly if you are not feeling any better.  You are welcome to return here to urgent care to have it done or you can take a home COVID-19 test.    If your influenza PCR test is positive, you will be contacted by phone.  Please complete full 5-day course of Tamiflu, this will be prescribed for you today but you should not pick it up until you vies that you have a positive influenza test result.     If both your COVID-19 and influenza PCR tests are negative, then you can safely assume that your illness is due to one of the many less serious illnesses circulating in our community right now.  Conservative care is recommended with rest, drinking plenty of clear fluids, eating only when hungry, taking supportive medications for your symptoms and avoiding being around other people.        Please read below to learn more about the medications, dosages and frequencies that I recommend to help alleviate your symptoms and to get you feeling better  soon:  Solu-Medrol IM (methylprednisolone):  To quickly address your significant respiratory inflammation, you were provided with an injection of Solu-Medrol in the office today.  You should continue to feel the full benefit of the steroid for the next 4 to 6 hours.    Medrol Dosepak (methylprednisolone): This is a steroid that will significantly calm your upper and lower airways, please take one row of tablets daily with your breakfast meal starting tomorrow morning until the prescription is complete.      ProAir, Ventolin, Proventil (albuterol): This inhaled medication contains a short acting beta agonist bronchodilator.  This medication works on the smooth muscle that opens and constricts of your airways by relaxing the muscle.  The result of relaxation of the smooth muscle is increased air movement and improved work of breathing.  This is a short acting medication that can be used every 4-6 hours as needed for increased work of breathing, shortness of breath, wheezing and excessive coughing.  I have provided you with plenty of refills.    Trelegy (fluticasone, vilanterol and umeclidinium):  This inhaled medication contains a corticosteroid and long-acting form of albuterol.  The inhaled steroid and this medication  is not absorbed into the body and will not cause side effects such as increased blood sugar levels, irritability, sleeplessness or weight gain.  Inhaled corticosteroid are sort of like topical steroid creams but, as you can imagine, it is not practical to  attempt to rub a steroid cream inside of your lungs.  The long-acting albuterol works similarly to the short acting albuterol found in your rescue inhaler but provides 24-hour relaxation of the smooth muscles that open and constrict your airways; your short acting rescue inhaler can only provide for a few hours this benefit for a few hours.  The third unique ingredient, umeclidinium, is an antimuscarinic and works similarly to your albuterol,  providing long-acting relaxation of the smooth muscles in your airway.  Please feel free to continue using your short acting rescue inhaler as often as needed throughout the day for shortness of breath, wheezing, and cough.  I have provided you with a sample that will last you for 2 weeks to help you get through this rough patch.  Please discuss continuing this medication with your new primary care provider at your first visit.   Zyrtec (cetirizine): This is an excellent second-generation antihistamine that helps to reduce respiratory inflammatory response to environmental allergens.  In some patients, this medication can cause daytime sleepiness so I recommend that you take 1 tablet daily at bedtime.  Provided you with plenty of refills.   Flonase (fluticasone): This is a steroid nasal spray that you use once daily, 1 spray in each nare.  This medication does not work well if you decide to use it only used as you feel you need to, it works best used on a daily basis.  After 3 to 5 days of use, you will notice significant reduction of the inflammation and mucus production that is currently being caused by exposure to allergens, whether seasonal or environmental.  The most common side effect of this medication is nosebleeds.  If you experience a nosebleed, please discontinue use for 1 week, then feel free to resume.  I have provided you with plenty of refills.    Atrovent (ipratropium): This is an excellent nasal decongestant spray I have added to your recommended nasal steroid that will not cause rebound congestion, please instill 2 sprays into each nare with each use.  Because nasal steroids can take several days before they begin to provide full benefit, I recommend that you use this spray in addition to the nasal steroid prescribed for you.  Please use it after you have used your nasal steroid and repeat up to 4 times daily as needed.  I have provided you with a prescription for this medication.     If  you find that your health insurance will not pay for allergy medications, please consider downloading the GoodRx app and using to get a better price than the "off the shelf" price.        Please follow-up within the next 5-7 days either with your primary care provider or urgent care if your symptoms do not resolve.  If you do not have a primary care provider, we will assist you in finding one.        Thank you for visiting urgent care today.  We appreciate the opportunity to participate in your care.

## 2022-05-10 NOTE — ED Provider Notes (Signed)
[facility]    CSN: [id number] Arrival date & time: 05/10/22  1258    HISTORY   Chief Complaint  Patient presents with   Nasal Congestion   Cough   Shortness of Breath   Sore Throat   HPI Tanya Clark is a pleasant, 31 y.o. female who presents to urgent care today. Patient complains of sore throat, congestion, cough productive of yellow sputum and intermittent episodes of shortness of breath with exertion.  Patient states she took a home COVID-19 test which she states was "inconclusive".  Patient states she could not tell if she could see a second line or not.  Patient states that the test was also expired.  Patient states has not tried medications for relief of her symptoms.  EMR reviewed by me.  Patient was seen for similar symptoms in March of this year, patient was diagnosed with exacerbation of mild intermittent reactive airway disease and was prescribed albuterol and allergy medications for treatment.  Patient has elevated heart rate on arrival, vital signs are otherwise normal.  The history is provided by the patient.   History reviewed. No pertinent past medical history. There are no problems to display for this patient.  History reviewed. No pertinent surgical history. OB History   No obstetric history on file.    Home Medications    Prior to Admission medications   Not on File    Family History History reviewed. No pertinent family history. Social History Social History   Tobacco Use   Smoking status: Never   Smokeless tobacco: Never  Substance Use Topics   Alcohol use: Yes    Comment: occasionally   Drug use: Never   Allergies   Penicillins  Review of Systems Review of Systems Pertinent findings revealed after performing a 14 point review of systems has been noted in the history of present illness.  Physical Exam Triage Vital Signs ED Triage Vitals  Enc Vitals Group     BP 05/07/21 0827 (!) 147/82     Pulse Rate 05/07/21 0827 72      Resp 05/07/21 0827 18     Temp 05/07/21 0827 98.3 F (36.8 C)     Temp Source 05/07/21 0827 Oral     SpO2 05/07/21 0827 98 %     Weight --      Height --      Head Circumference --      Peak Flow --      Pain Score 05/07/21 0826 5     Pain Loc --      Pain Edu? --      Excl. in GC? --   No data found.  Updated Vital Signs BP 120/88 (BP Location: Right Arm)   Pulse (!) 125   Temp 98.1 F (36.7 C) (Oral)   Resp 18   LMP 04/24/2022   SpO2 95%   Physical Exam Vitals and nursing note reviewed.  Constitutional:      General: She is not in acute distress.    Appearance: Normal appearance. She is not ill-appearing.  HENT:     Head: Normocephalic and atraumatic.     Salivary Glands: Right salivary gland is not diffusely enlarged or tender. Left salivary gland is not diffusely enlarged or tender.     Right Ear: Ear canal and external ear normal. No drainage. A middle ear effusion is present. There is no impacted cerumen. Tympanic membrane is bulging. Tympanic membrane is not injected or erythematous.  Left Ear: Ear canal and external ear normal. No drainage. A middle ear effusion is present. There is no impacted cerumen. Tympanic membrane is bulging. Tympanic membrane is not injected or erythematous.     Ears:     Comments: Bilateral EACs normal, both TMs bulging with clear fluid    Nose: Rhinorrhea present. No nasal deformity, septal deviation, signs of injury, nasal tenderness, mucosal edema or congestion. Rhinorrhea is clear.     Right Nostril: Occlusion present. No foreign body, epistaxis or septal hematoma.     Left Nostril: Occlusion present. No foreign body, epistaxis or septal hematoma.     Right Turbinates: Enlarged, swollen and pale.     Left Turbinates: Enlarged, swollen and pale.     Right Sinus: No maxillary sinus tenderness or frontal sinus tenderness.     Left Sinus: No maxillary sinus tenderness or frontal sinus tenderness.     Mouth/Throat:     Lips: Pink.  No lesions.     Mouth: Mucous membranes are moist. No oral lesions.     Pharynx: Oropharynx is clear. Uvula midline. No posterior oropharyngeal erythema or uvula swelling.     Tonsils: No tonsillar exudate. 0 on the right. 0 on the left.     Comments: Postnasal drip Eyes:     General: Lids are normal.        Right eye: No discharge.        Left eye: No discharge.     Extraocular Movements: Extraocular movements intact.     Conjunctiva/sclera: Conjunctivae normal.     Right eye: Right conjunctiva is not injected.     Left eye: Left conjunctiva is not injected.  Neck:     Trachea: Trachea and phonation normal.  Cardiovascular:     Rate and Rhythm: Normal rate and regular rhythm.     Pulses: Normal pulses.     Heart sounds: Normal heart sounds. No murmur heard.    No friction rub. No gallop.  Pulmonary:     Effort: Pulmonary effort is normal. No tachypnea, bradypnea, accessory muscle usage, prolonged expiration, respiratory distress or retractions.     Breath sounds: No stridor, decreased air movement or transmitted upper airway sounds. Examination of the right-upper field reveals wheezing. Examination of the left-upper field reveals decreased breath sounds. Examination of the right-middle field reveals wheezing. Examination of the left-middle field reveals decreased breath sounds. Examination of the right-lower field reveals decreased breath sounds. Examination of the left-lower field reveals decreased breath sounds. Decreased breath sounds and wheezing present. No rhonchi or rales.     Comments: Patient reported significant improvement of work of breathing post nebulized bronchodilator treatment.  Breath sounds after treatment revealed continued wheezing and right upper and middle lobes with improved breath sounds throughout, no rales or rhonchi appreciated. Chest:     Chest wall: No tenderness.  Musculoskeletal:        General: Normal range of motion.     Cervical back: Normal range of  motion and neck supple. Normal range of motion.  Lymphadenopathy:     Cervical: No cervical adenopathy.  Skin:    General: Skin is warm and dry.     Findings: No erythema or rash.  Neurological:     General: No focal deficit present.     Mental Status: She is alert and oriented to person, place, and time.  Psychiatric:        Mood and Affect: Mood normal.        Behavior: Behavior  normal.     Visual Acuity Right Eye Distance:   Left Eye Distance:   Bilateral Distance:    Right Eye Near:   Left Eye Near:    Bilateral Near:     [facility] Couse / Diagnostics / Procedures:     Radiology No results found.  Procedures Procedures (including critical care time) EKG  Pending results:  Labs Reviewed  RESP PANEL BY RT-PCR (FLU A&B, COVID) [id number]    Medications Ordered in UC: Medications  methylPREDNISolone sodium succinate (SOLU-MEDROL) 125 mg/2 mL injection 125 mg (125 mg Intramuscular Given 05/10/22 1528)  albuterol (PROVENTIL) (2.5 MG/3ML) 0.083% nebulizer solution 2.5 mg (2.5 mg Nebulization Given 05/10/22 1529)    UC Diagnoses / Final Clinical Impressions(s)   I have reviewed the triage vital signs and the nursing notes.  Pertinent labs & imaging results that were available during my care of the patient were reviewed by me and considered in my medical decision making (see chart for details).    Final diagnoses:  Mild intermittent reactive airway disease with acute exacerbation  Seasonal allergic rhinitis, unspecified trigger  Viral respiratory infection   Patient reported improved work of breathing post nebulized bronchodilator treatment and injection of methylprednisolone.  Breath sounds were also significantly improved but wheezing was not entirely resolved.  Patient was provided with renewal of allergy medications.  Patient also provided with a renewal of albuterol.  Patient advised to continue methylprednisolone and a tapering dose.  Influenza and COVID-19 testing  performed, will notify patient of results once received.  Physical exam concerning for possible influenza, patient provided with a prescription for Tamiflu should her result for influenza be positive.  Patient provided with a prescription for Trelegy and advised to follow-up with her primary care provider to discuss this medication and possible refills if she finds it effective.  Return precautions advised.  ED Prescriptions     Medication Sig Dispense Auth. Provider   fluticasone (FLONASE) 50 MCG/ACT nasal spray Place 1 spray into both nostrils daily. 47.4 mL Lynden Oxford Scales, PA-C   cetirizine (ZYRTEC ALLERGY) 10 MG tablet Take 1 tablet (10 mg total) by mouth at bedtime. 90 tablet Lynden Oxford Scales, PA-C   albuterol (VENTOLIN HFA) 108 (90 Base) MCG/ACT inhaler Inhale 2 puffs into the lungs every 6 (six) hours as needed for wheezing or shortness of breath (Cough). 18 g Lynden Oxford Scales, PA-C   ipratropium (ATROVENT) 0.06 % nasal spray Place 2 sprays into both nostrils 3 (three) times daily. As needed for nasal congestion, runny nose 15 mL Lynden Oxford Scales, PA-C   methylPREDNISolone (MEDROL DOSEPAK) 4 MG TBPK tablet Take 24 mg on day 1, 20 mg on day 2, 16 mg on day 3, 12 mg on day 4, 8 mg on day 5, 4 mg on day 6.  Take all tablets in each row at once, do not spread tablets out throughout the day. 21 tablet Lynden Oxford Scales, PA-C   Fluticasone-Umeclidin-Vilant (TRELEGY ELLIPTA) 200-62.5-25 MCG/ACT AEPB Inhale 1 puff into the lungs in the morning for 14 days. 1 each Lynden Oxford Scales, PA-C   oseltamivir (TAMIFLU) 75 MG capsule Take 1 capsule (75 mg total) by mouth every 12 (twelve) hours for 5 days. 10 capsule Lynden Oxford Scales, PA-C      PDMP not reviewed this encounter.  Disposition Upon Discharge:  Condition: stable for discharge home Home: take medications as prescribed; routine discharge instructions as discussed; follow up as advised.  Patient presented  with an acute illness  with associated systemic symptoms and significant discomfort requiring urgent management. In my opinion, this is a condition that a prudent lay person (someone who possesses an average knowledge of health and medicine) may potentially expect to result in complications if not addressed urgently such as respiratory distress, impairment of bodily function or dysfunction of bodily organs.   Routine symptom specific, illness specific and/or disease specific instructions were discussed with the patient and/or caregiver at length.   As such, the patient has been evaluated and assessed, work-up was performed and treatment was provided in alignment with urgent care protocols and evidence based medicine.  Patient/parent/caregiver has been advised that the patient may require follow up for further testing and treatment if the symptoms continue in spite of treatment, as clinically indicated and appropriate.  If the patient was tested for COVID-19, Influenza and/or RSV, then the patient/parent/guardian was advised to isolate at home pending the results of his/her diagnostic coronavirus test and potentially longer if they're positive. I have also advised pt that if his/her COVID-19 test returns positive, it's recommended to self-isolate for at least 10 days after symptoms first appeared AND until fever-free for 24 hours without fever reducer AND other symptoms have improved or resolved. Discussed self-isolation recommendations as well as instructions for household member/close contacts as per the [facility] and [place] DHHS, and also gave patient the COVID packet with this information.  Patient/parent/caregiver has been advised to return to the [facility] or PCP in 3-5 days if no better; to PCP or the Emergency Department if new signs and symptoms develop, or if the current signs or symptoms continue to change or worsen for further workup, evaluation and treatment as clinically indicated and appropriate  The patient  will follow up with their current PCP if and as advised. If the patient does not currently have a PCP we will assist them in obtaining one.   The patient may need specialty follow up if the symptoms continue, in spite of conservative treatment and management, for further workup, evaluation, consultation and treatment as clinically indicated and appropriate.  Patient/parent/caregiver verbalized understanding and agreement of plan as discussed.  All questions were addressed during visit.  Please see discharge instructions below for further details of plan.  Discharge Instructions:   Discharge Instructions      As before, I have avoided diagnosing you with asthma because I do not have adequate diagnostic testing here at urgent care to support asthma diagnosis.  I do believe that you have something called reactive airway disease which appears to be intermittent and not persistent, based on the history you provided to me today.,  It definitely deserves to be treated  You received a COVID-19 and influenza PCR next today.  The results of your PCR testing will be posted to your MyChart once it is complete.  This typically takes 6 to 12 hours.    If your COVID-19 PCR test is positive, you will be contacted by phone.  Please discuss with the callback nurse whether or not you would benefit from antiviral therapy treatment for COVID-19.  With your history of reactive airway disease and frequent exacerbations, I believe that you would benefit from a 5-day course of Paxlovid.     If your COVID-19 PCR test is negative, please consider retesting in the next 2 to 3 days, particularly if you are not feeling any better.  You are welcome to return here to urgent care to have it done or you can take a home COVID-19 test.  If your influenza PCR test is positive, you will be contacted by phone.  Please complete full 5-day course of Tamiflu, this will be prescribed for you today but you should not pick it up until you  vies that you have a positive influenza test result.     If both your COVID-19 and influenza PCR tests are negative, then you can safely assume that your illness is due to one of the many less serious illnesses circulating in our community right now.  Conservative care is recommended with rest, drinking plenty of clear fluids, eating only when hungry, taking supportive medications for your symptoms and avoiding being around other people.        Please read below to learn more about the medications, dosages and frequencies that I recommend to help alleviate your symptoms and to get you feeling better soon:  Solu-Medrol IM (methylprednisolone):  To quickly address your significant respiratory inflammation, you were provided with an injection of Solu-Medrol in the office today.  You should continue to feel the full benefit of the steroid for the next 4 to 6 hours.    Medrol Dosepak (methylprednisolone): This is a steroid that will significantly calm your upper and lower airways, please take one row of tablets daily with your breakfast meal starting tomorrow morning until the prescription is complete.      ProAir, Ventolin, Proventil (albuterol): This inhaled medication contains a short acting beta agonist bronchodilator.  This medication works on the smooth muscle that opens and constricts of your airways by relaxing the muscle.  The result of relaxation of the smooth muscle is increased air movement and improved work of breathing.  This is a short acting medication that can be used every 4-6 hours as needed for increased work of breathing, shortness of breath, wheezing and excessive coughing.  I have provided you with plenty of refills.    Trelegy (fluticasone, vilanterol and umeclidinium):  This inhaled medication contains a corticosteroid and long-acting form of albuterol.  The inhaled steroid and this medication  is not absorbed into the body and will not cause side effects such as increased blood sugar  levels, irritability, sleeplessness or weight gain.  Inhaled corticosteroid are sort of like topical steroid creams but, as you can imagine, it is not practical to attempt to rub a steroid cream inside of your lungs.  The long-acting albuterol works similarly to the short acting albuterol found in your rescue inhaler but provides 24-hour relaxation of the smooth muscles that open and constrict your airways; your short acting rescue inhaler can only provide for a few hours this benefit for a few hours.  The third unique ingredient, umeclidinium, is an antimuscarinic and works similarly to your albuterol, providing long-acting relaxation of the smooth muscles in your airway.  Please feel free to continue using your short acting rescue inhaler as often as needed throughout the day for shortness of breath, wheezing, and cough.  I have provided you with a sample that will last you for 2 weeks to help you get through this rough patch.  Please discuss continuing this medication with your new primary care provider at your first visit.   Zyrtec (cetirizine): This is an excellent second-generation antihistamine that helps to reduce respiratory inflammatory response to environmental allergens.  In some patients, this medication can cause daytime sleepiness so I recommend that you take 1 tablet daily at bedtime.  Provided you with plenty of refills.   Flonase (fluticasone): This is a steroid nasal spray that  you use once daily, 1 spray in each nare.  This medication does not work well if you decide to use it only used as you feel you need to, it works best used on a daily basis.  After 3 to 5 days of use, you will notice significant reduction of the inflammation and mucus production that is currently being caused by exposure to allergens, whether seasonal or environmental.  The most common side effect of this medication is nosebleeds.  If you experience a nosebleed, please discontinue use for 1 week, then feel free to  resume.  I have provided you with plenty of refills.    Atrovent (ipratropium): This is an excellent nasal decongestant spray I have added to your recommended nasal steroid that will not cause rebound congestion, please instill 2 sprays into each nare with each use.  Because nasal steroids can take several days before they begin to provide full benefit, I recommend that you use this spray in addition to the nasal steroid prescribed for you.  Please use it after you have used your nasal steroid and repeat up to 4 times daily as needed.  I have provided you with a prescription for this medication.     If you find that your health insurance will not pay for allergy medications, please consider downloading the GoodRx app and using to get a better price than the "off the shelf" price.        Please follow-up within the next 5-7 days either with your primary care provider or urgent care if your symptoms do not resolve.  If you do not have a primary care provider, we will assist you in finding one.        Thank you for visiting urgent care today.  We appreciate the opportunity to participate in your care.           This office note has been dictated using Teaching laboratory technician.  Unfortunately, this method of dictation can sometimes lead to typographical or grammatical errors.  I apologize for your inconvenience in advance if this occurs.  Please do not hesitate to reach out to me if clarification is needed.      Theadora Rama Scales, [place] 05/13/22 [phone]

## 2023-05-04 ENCOUNTER — Other Ambulatory Visit: Payer: Managed Care, Other (non HMO)

## 2023-05-04 ENCOUNTER — Ambulatory Visit
Admission: RE | Admit: 2023-05-04 | Discharge: 2023-05-04 | Disposition: A | Discharge status: Home / Self Care | Payer: No Typology Code available for payment source | Source: Ambulatory Visit | Attending: Family Medicine | Admitting: Family Medicine

## 2023-05-04 ENCOUNTER — Other Ambulatory Visit: Payer: Self-pay

## 2023-05-04 ENCOUNTER — Encounter: Payer: Self-pay | Admitting: Family Medicine

## 2023-05-04 ENCOUNTER — Ambulatory Visit (INDEPENDENT_AMBULATORY_CARE_PROVIDER_SITE_OTHER): Payer: Self-pay | Admitting: Family Medicine

## 2023-05-04 VITALS — BP 121/82 | Ht 65.0 in | Wt 210.0 lb

## 2023-05-04 DIAGNOSIS — Z0189 Encounter for other specified special examinations: Secondary | ICD-10-CM

## 2023-05-04 LAB — GLUCOSE, POCT (MANUAL RESULT ENTRY): POC Glucose: 97 mg/dL (ref 70–99)

## 2023-05-04 LAB — POCT UA - GLUCOSE/PROTEIN
Glucose, UA: NEGATIVE
Protein, UA: NEGATIVE

## 2023-05-04 LAB — POCT HEMOGLOBIN: Hemoglobin: 12.9 g/dL (ref 11–14.6)

## 2023-05-04 NOTE — Progress Notes (Signed)
PCP: Patient, No Pcp Per CC: Dive physical Subjective:   HPI: Patient is a 32 y.o. female here for dive physical.  Patient currently doing well  Patient with hx of recurrent bronchitis - hx COVID July 2022 - Since COVID infection will tend to get bronchitis after mild URI - does not take medication regularly - has rescue inhaler to use prn - using Xyzal daily  Recent dx of anxiety - new dx in the last year due to loss of parents - Recently started on Zoloft - using Buspar 1-2 times a day prn - no issues with diving - following with PCP for this - Dr Maryelizabeth Rowan - in process of setting up appt with counselor  Patient denies history of seizures, barotrauma, pneumothorax, chronic sinus problems, asthma, panic attacks. No history of neurologic disorders, cancer, hearing difficulty.  History reviewed. No pertinent past medical history.  Current Outpatient Medications on File Prior to Visit  Medication Sig Dispense Refill   AIRSUPRA 90-80 MCG/ACT AERO Inhale 2 puffs into the lungs 4 (four) times daily as needed (cough).     albuterol (VENTOLIN HFA) 108 (90 Base) MCG/ACT inhaler Inhale 2 puffs into the lungs every 6 (six) hours as needed for wheezing or shortness of breath (Cough). 54 g 2   busPIRone (BUSPAR) 5 MG tablet Take 5 mg by mouth 2 (two) times daily as needed.     fluticasone (FLONASE) 50 MCG/ACT nasal spray Place 1 spray into both nostrils daily. 47.4 mL 1   levocetirizine (XYZAL) 5 MG tablet Take 5 mg by mouth daily.     sertraline (ZOLOFT) 50 MG tablet Take 50 mg by mouth daily.     No current facility-administered medications on file prior to visit.    History reviewed. No pertinent surgical history.  Allergies  Allergen Reactions   Penicillins Hives    Reports history of hives when she was young    Social History   Socioeconomic History   Marital status: Single    Spouse name: Not on file   Number of children: Not on file   Years of education: Not on  file   Highest education level: Not on file  Occupational History   Not on file  Tobacco Use   Smoking status: Never   Smokeless tobacco: Never  Substance and Sexual Activity   Alcohol use: Yes    Comment: occasionally   Drug use: Never   Sexual activity: Not on file  Other Topics Concern   Not on file  Social History Narrative   Not on file   Social Determinants of Health   Financial Resource Strain: Not on file  Food Insecurity: Not on file  Transportation Needs: Not on file  Physical Activity: Not on file  Stress: Not on file  Social Connections: Unknown (11/23/2021)   Received from [facility], [facility]   Social Network    Social Network: Not on file  Intimate Partner Violence: Unknown (10/15/2021)   Received from [facility], [facility]   HITS    Physically Hurt: Not on file    Insult or Talk Down To: Not on file    Threaten Physical Harm: Not on file    Scream or Curse: Not on file    History reviewed. No pertinent family history.  BP 121/82   Ht 5\' 5"  (1.651 m)   Wt 210 lb (95.3 kg)   BMI 34.95 kg/m       No data to display  No data to display          Review of Systems: See HPI above.     Objective:  Physical Exam:  Gen: NAD, comfortable in exam room  HEENT: PERRL, EOMI.  Visual fields full.  TMs and pharynx normal. CV: RRR no MRG seated or standing Lungs: CTAB without wheezes, rales, or rhonchi Abd: soft, nt, nd.  No HSM. Neuro: CN 2-12 grossly intact Strength 5/5 upper and lower extremities Sensation intact to light touch throughout MSRs 2+ biceps, triceps, brachioradialis, patellar, and achilles tendons. Romberg negative Double leg stance with 0 errors Tandem stance with 0 errors Finger to nose normal bilaterally Vertical and horizontal saccades, fixed gaze with head rotation 20 trials without symptoms or nystagmus   CXR: CXR completed today 05/04/23, personally reviewed and interpreted by me showing  normal exam without abnormalities. Hb: 12.9 - normal Glucose: 97 - normal Urine protein and glucose: negative  Assessment & Plan Encounter for other specified special examinations Dive physical - patient is cleared for scuba diving without restrictions - forms completed and returned to patient, copy to be scanned to chart - recommend she see PCP for updated Tdap, she is unsure of her last  - f/u prn

## 2023-06-12 DIAGNOSIS — R059 Cough, unspecified: Secondary | ICD-10-CM | POA: Diagnosis not present

## 2023-06-26 DIAGNOSIS — E785 Hyperlipidemia, unspecified: Secondary | ICD-10-CM | POA: Diagnosis not present

## 2023-06-26 DIAGNOSIS — R739 Hyperglycemia, unspecified: Secondary | ICD-10-CM | POA: Diagnosis not present

## 2023-08-21 DIAGNOSIS — G4700 Insomnia, unspecified: Secondary | ICD-10-CM | POA: Diagnosis not present

## 2023-08-21 DIAGNOSIS — F331 Major depressive disorder, recurrent, moderate: Secondary | ICD-10-CM | POA: Diagnosis not present

## 2023-08-21 DIAGNOSIS — F411 Generalized anxiety disorder: Secondary | ICD-10-CM | POA: Diagnosis not present

## 2023-08-25 DIAGNOSIS — F411 Generalized anxiety disorder: Secondary | ICD-10-CM | POA: Diagnosis not present

## 2023-08-25 DIAGNOSIS — F331 Major depressive disorder, recurrent, moderate: Secondary | ICD-10-CM | POA: Diagnosis not present

## 2023-08-25 DIAGNOSIS — G4700 Insomnia, unspecified: Secondary | ICD-10-CM | POA: Diagnosis not present

## 2023-09-26 DIAGNOSIS — E785 Hyperlipidemia, unspecified: Secondary | ICD-10-CM | POA: Diagnosis not present

## 2023-09-26 DIAGNOSIS — R739 Hyperglycemia, unspecified: Secondary | ICD-10-CM | POA: Diagnosis not present

## 2023-10-09 DIAGNOSIS — E782 Mixed hyperlipidemia: Secondary | ICD-10-CM | POA: Diagnosis not present

## 2023-10-09 DIAGNOSIS — Z789 Other specified health status: Secondary | ICD-10-CM | POA: Diagnosis not present

## 2023-10-09 DIAGNOSIS — I10 Essential (primary) hypertension: Secondary | ICD-10-CM | POA: Diagnosis not present

## 2023-10-09 DIAGNOSIS — R7303 Prediabetes: Secondary | ICD-10-CM | POA: Diagnosis not present

## 2023-11-20 DIAGNOSIS — E785 Hyperlipidemia, unspecified: Secondary | ICD-10-CM | POA: Diagnosis not present

## 2023-11-20 DIAGNOSIS — R5383 Other fatigue: Secondary | ICD-10-CM | POA: Diagnosis not present

## 2023-11-27 DIAGNOSIS — E782 Mixed hyperlipidemia: Secondary | ICD-10-CM | POA: Diagnosis not present

## 2023-11-27 DIAGNOSIS — Z789 Other specified health status: Secondary | ICD-10-CM | POA: Diagnosis not present

## 2023-11-27 DIAGNOSIS — G4700 Insomnia, unspecified: Secondary | ICD-10-CM | POA: Diagnosis not present

## 2023-11-27 DIAGNOSIS — I10 Essential (primary) hypertension: Secondary | ICD-10-CM | POA: Diagnosis not present

## 2023-12-25 DIAGNOSIS — G4700 Insomnia, unspecified: Secondary | ICD-10-CM | POA: Diagnosis not present

## 2023-12-25 DIAGNOSIS — R739 Hyperglycemia, unspecified: Secondary | ICD-10-CM | POA: Diagnosis not present

## 2023-12-25 DIAGNOSIS — F411 Generalized anxiety disorder: Secondary | ICD-10-CM | POA: Diagnosis not present

## 2023-12-25 DIAGNOSIS — F331 Major depressive disorder, recurrent, moderate: Secondary | ICD-10-CM | POA: Diagnosis not present

## 2024-01-01 DIAGNOSIS — R7303 Prediabetes: Secondary | ICD-10-CM | POA: Diagnosis not present

## 2024-01-01 DIAGNOSIS — I10 Essential (primary) hypertension: Secondary | ICD-10-CM | POA: Diagnosis not present

## 2024-01-01 DIAGNOSIS — L680 Hirsutism: Secondary | ICD-10-CM | POA: Diagnosis not present

## 2024-01-01 DIAGNOSIS — E282 Polycystic ovarian syndrome: Secondary | ICD-10-CM | POA: Diagnosis not present

## 2024-01-16 DIAGNOSIS — I10 Essential (primary) hypertension: Secondary | ICD-10-CM | POA: Diagnosis not present

## 2024-01-16 DIAGNOSIS — R400 Somnolence: Secondary | ICD-10-CM | POA: Diagnosis not present

## 2024-01-16 DIAGNOSIS — E282 Polycystic ovarian syndrome: Secondary | ICD-10-CM | POA: Diagnosis not present

## 2024-02-05 DIAGNOSIS — F411 Generalized anxiety disorder: Secondary | ICD-10-CM | POA: Diagnosis not present

## 2024-02-05 DIAGNOSIS — F331 Major depressive disorder, recurrent, moderate: Secondary | ICD-10-CM | POA: Diagnosis not present

## 2024-02-05 DIAGNOSIS — Z1159 Encounter for screening for other viral diseases: Secondary | ICD-10-CM | POA: Diagnosis not present

## 2024-02-05 DIAGNOSIS — G4700 Insomnia, unspecified: Secondary | ICD-10-CM | POA: Diagnosis not present

## 2024-02-05 DIAGNOSIS — J069 Acute upper respiratory infection, unspecified: Secondary | ICD-10-CM | POA: Diagnosis not present

## 2024-02-19 DIAGNOSIS — R7303 Prediabetes: Secondary | ICD-10-CM | POA: Diagnosis not present

## 2024-02-19 DIAGNOSIS — R030 Elevated blood-pressure reading, without diagnosis of hypertension: Secondary | ICD-10-CM | POA: Diagnosis not present

## 2024-02-19 DIAGNOSIS — E282 Polycystic ovarian syndrome: Secondary | ICD-10-CM | POA: Diagnosis not present

## 2024-02-19 DIAGNOSIS — I10 Essential (primary) hypertension: Secondary | ICD-10-CM | POA: Diagnosis not present

## 2024-03-25 DIAGNOSIS — Z7185 Encounter for immunization safety counseling: Secondary | ICD-10-CM | POA: Diagnosis not present

## 2024-03-25 DIAGNOSIS — J069 Acute upper respiratory infection, unspecified: Secondary | ICD-10-CM | POA: Diagnosis not present

## 2024-03-25 DIAGNOSIS — J45909 Unspecified asthma, uncomplicated: Secondary | ICD-10-CM | POA: Diagnosis not present

## 2024-04-01 DIAGNOSIS — Z0000 Encounter for general adult medical examination without abnormal findings: Secondary | ICD-10-CM | POA: Diagnosis not present

## 2024-04-08 DIAGNOSIS — Z23 Encounter for immunization: Secondary | ICD-10-CM | POA: Diagnosis not present

## 2024-04-08 DIAGNOSIS — Z7185 Encounter for immunization safety counseling: Secondary | ICD-10-CM | POA: Diagnosis not present

## 2024-04-08 DIAGNOSIS — I10 Essential (primary) hypertension: Secondary | ICD-10-CM | POA: Diagnosis not present

## 2024-04-08 DIAGNOSIS — D72829 Elevated white blood cell count, unspecified: Secondary | ICD-10-CM | POA: Diagnosis not present

## 2024-04-08 DIAGNOSIS — Z0000 Encounter for general adult medical examination without abnormal findings: Secondary | ICD-10-CM | POA: Diagnosis not present

## 2024-04-22 DIAGNOSIS — Z7185 Encounter for immunization safety counseling: Secondary | ICD-10-CM | POA: Diagnosis not present

## 2024-04-22 DIAGNOSIS — F411 Generalized anxiety disorder: Secondary | ICD-10-CM | POA: Diagnosis not present

## 2024-04-22 DIAGNOSIS — Z23 Encounter for immunization: Secondary | ICD-10-CM | POA: Diagnosis not present

## 2024-04-22 DIAGNOSIS — G4700 Insomnia, unspecified: Secondary | ICD-10-CM | POA: Diagnosis not present

## 2024-04-22 DIAGNOSIS — F331 Major depressive disorder, recurrent, moderate: Secondary | ICD-10-CM | POA: Diagnosis not present

## 2024-04-22 DIAGNOSIS — J069 Acute upper respiratory infection, unspecified: Secondary | ICD-10-CM | POA: Diagnosis not present
